# Patient Record
Sex: Female | Born: 2000 | Race: White | Hispanic: Yes | Marital: Single | Smoking: Never smoker
Health system: Southern US, Community
[De-identification: ages and names within clinical notes are randomized; demographics above are authoritative.]

## PROBLEM LIST (undated history)

## (undated) DIAGNOSIS — M21929 Unspecified acquired deformity of unspecified upper arm: Secondary | ICD-10-CM

---
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
## 2022-08-29 ENCOUNTER — Emergency Department
Admission: EM | Admit: 2022-08-29 | Discharge: 2022-08-29 | Disposition: A | Discharge status: Home / Self Care | Payer: Self-pay | Attending: Student in an Organized Health Care Education/Training Program | Admitting: Student in an Organized Health Care Education/Training Program

## 2022-08-29 ENCOUNTER — Encounter: Payer: Self-pay | Admitting: Emergency Medicine

## 2022-08-29 ENCOUNTER — Other Ambulatory Visit: Payer: Self-pay

## 2022-08-29 ENCOUNTER — Emergency Department: Payer: Self-pay

## 2022-08-29 DIAGNOSIS — Y92410 Unspecified street and highway as the place of occurrence of the external cause: Secondary | ICD-10-CM | POA: Diagnosis not present

## 2022-08-29 DIAGNOSIS — S40022A Contusion of left upper arm, initial encounter: Secondary | ICD-10-CM | POA: Insufficient documentation

## 2022-08-29 DIAGNOSIS — M545 Low back pain, unspecified: Secondary | ICD-10-CM | POA: Insufficient documentation

## 2022-08-29 DIAGNOSIS — S4992XA Unspecified injury of left shoulder and upper arm, initial encounter: Secondary | ICD-10-CM | POA: Diagnosis present

## 2022-08-29 LAB — POC URINE PREG, ED: Preg Test, Ur: NEGATIVE

## 2022-08-29 MED ORDER — LIDOCAINE 5 % EX PTCH
1.0000 | MEDICATED_PATCH | CUTANEOUS | Status: DC
  Administered 2022-08-29: 1 via TRANSDERMAL
  Filled 2022-08-29: qty 1

## 2022-08-29 MED ORDER — ACETAMINOPHEN 325 MG PO TABS
650.0000 mg | ORAL_TABLET | Freq: Once | ORAL | Status: AC
  Administered 2022-08-29: 650 mg via ORAL
  Filled 2022-08-29: qty 2
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
## 2022-08-29 NOTE — Discharge Instructions (Addendum)
You may continue to take Tylenol/ibuprofen per package instructions as needed for pain.  Please return for any new, worsening, or change in symptoms or concerns.  It was a pleasure caring for you today.
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
## 2023-04-22 ENCOUNTER — Ambulatory Visit: Payer: Self-pay

## 2023-06-16 ENCOUNTER — Ambulatory Visit: Payer: Self-pay

## 2024-06-09 ENCOUNTER — Ambulatory Visit: Payer: Self-pay

## 2024-06-23 ENCOUNTER — Ambulatory Visit: Payer: Self-pay | Admitting: Family Medicine

## 2024-06-23 ENCOUNTER — Encounter: Payer: Self-pay | Admitting: Family Medicine

## 2024-06-23 ENCOUNTER — Ambulatory Visit: Payer: Self-pay

## 2024-06-23 VITALS — BP 148/86 | HR 74 | Ht 67.0 in | Wt 241.4 lb

## 2024-06-23 DIAGNOSIS — Z3009 Encounter for other general counseling and advice on contraception: Secondary | ICD-10-CM

## 2024-06-23 DIAGNOSIS — Z113 Encounter for screening for infections with a predominantly sexual mode of transmission: Secondary | ICD-10-CM

## 2024-06-23 DIAGNOSIS — Z30430 Encounter for insertion of intrauterine contraceptive device: Secondary | ICD-10-CM

## 2024-06-23 DIAGNOSIS — Z124 Encounter for screening for malignant neoplasm of cervix: Secondary | ICD-10-CM

## 2024-06-23 LAB — WET PREP FOR TRICH, YEAST, CLUE
Clue Cell Exam: NEGATIVE
Trichomonas Exam: NEGATIVE
Yeast Exam: NEGATIVE

## 2024-06-23 LAB — HM HIV SCREENING LAB: HM HIV Screening: NEGATIVE

## 2024-06-23 MED ORDER — PARAGARD INTRAUTERINE COPPER IU IUD
1.0000 | INTRAUTERINE_SYSTEM | Freq: Once | INTRAUTERINE | Status: AC
  Administered 2024-06-23: 1 via INTRAUTERINE

## 2024-06-23 NOTE — Progress Notes (Signed)
 Pt is here PE and IUD insertion. Wet Prep results reviewed with pt and requires no treatment. IUD inserted successfully by Damien Satchel, [facility] and  pt tolerated well to the insertion process with no complications. Condoms declined, instruction sheet and reminder card given. Opportunity given to Patient to ask questions for any clarifications, questions answered. Wilkie Drought, RN.

## 2024-06-23 NOTE — Progress Notes (Signed)
 [employer] [facility] DEPARTMENT [facility] [address] [place] [address] Main phone: [phone]  Family Planning Visit - Initial Visit  Subjective:  Taylor Fitzgerald is a 23 y.o.  No obstetric history on file.   being seen today for an initial annual visit and to discuss reproductive life planning.  The patient is currently using IUD (or IUS) for pregnancy prevention. Patient does not want a pregnancy in the next year.   Patient reports they are looking for a method with the following characteristics:  High efficacy at preventing pregnancy Method that does not involve too much memory  Patient has the following medical conditions: Patient Active Problem List   Diagnosis Date Noted   Morbidly obese (HCC) 234 lbs 05/02/2021   Hirsutism 05/02/2021   Marijuana use 05/02/2021   Chief Complaint  Patient presents with   Annual Exam    Pt is here PE and IUD insertion   HPI Patient reports desire for a copper  IUD.  Worried about her fertility as she has not gotten pregnant on accident with unprotected sex and asking for a referral for fertility. Does not desire a pregnancy anytime soon and desires IUD. Discussed that fertility referrals are warranted if a person attempts to conceive for a full year without success while they are younger than 78.  Currently menstruating, last sex 2 weeks ago. Desires STI testing. No breast or vaginal concerns. BP 148/86 today: patient reports she is very nervous for IUD insertion. No hx of high blood pressure.   Review of Systems  All other systems reviewed and are negative.  Diabetes screening This patient is 23 y.o. with a BMI of Body mass index is 37.81 kg/m.Taylor Fitzgerald  Is patient eligible for diabetes screening (age >35 and BMI >25)?  no  Was Hgb A1c ordered? no  STI screening Patient reports 2 of partners in last year.  Does this patient desire STI screening?  Yes  Hepatitis C screening Has patient been  screened once for HCV in the past?  No  No results found for: HCVAB  Does the patient meet criteria for HCV testing? No  (If yes-- Screen for HCV through [facility]) Criteria:  Since the last HCV result, does the patient have any of the following? - Current drug use - Have a partner with drug use - Has been incarcerated  Hepatitis B screening Does the patient meet criteria for HBV testing? No Criteria:  -Household, sexual or needle sharing contact with HBV -History of drug use -HIV positive -Those with known Hep C  Cervical Cancer Screening  First pap completed today.  Health Maintenance Due  Topic Date Due   CHLAMYDIA SCREENING  Never done   HPV VACCINES (1 - 3-dose series) Never done   Meningococcal B Vaccine (1 of 2 - Standard) Never done   Hepatitis C Screening  Never done   DTaP/Tdap/Td (1 - Tdap) Never done   Hepatitis B Vaccines 19-59 Average Risk (1 of 3 - 19+ 3-dose series) Never done   Cervical Cancer Screening (Pap smear)  Never done   INFLUENZA VACCINE  Never done   COVID-19 Vaccine (1 - 2024-25 season) Never done    The following portions of the patient's history were reviewed and updated as appropriate: allergies, current medications, past family history, past medical history, past social history, past surgical history and problem list. Problem list updated.  See flowsheet for further details and programmatic requirements Hyperlink available at the top of  the signed note in blue.  Flow sheet content below:  Pregnancy Intention Screening Does the patient want to become pregnant in the next year?: No Does the patient's partner want to become pregnant in the next year?: No Would the patient like to discuss contraceptive options today?: Yes Other:  Password: [id number] Sexual History What age did you start your period?: 14 How often do you have your period?: monthly Date of last sex?: 06/09/24 Has the patient had unprotected sex within the last 5 days?:  No Do you have sex with men, women, both men and women?: Men only In the past 2 months how many partners have you had sex with?: 1 In the past 12 months, how many partners have you had sex with?: 2 Is it possible that any of your sex partners in the past 12 months had sex with someone else whild they were still in a sexual relationship with you?: No What ways do you have sex?: Vaginal Do you or your partner use condoms and/or dental dams every time you have vaginal, oral or anal sex?: Sometimes Do you douche?: Yes How often?: after every period Last time?: today Date of last HIV test?: 04/27/21 Have you ever had an STD?: No Have any of your partners had an STD?: No Have you or your partner ever shot up drugs?: No Have any of your partners used drugs in the past?: No Have you or your partners exchanged money or drugs for sex?: No Risk Factors for Hep B Household, sexual, or needle sharing contact of a person infected with Hep B: No Sexual contact with a person who uses drugs not as prescribed?: No Currently or Ever used drugs not as prescribed: No HIV Positive: No PRep Patient: No Men who have sex with men: No Have Hepatitis C: No History of Incarceration: No History of Homeslessness?: No Anal sex following anal drug use?: No Risk Factors for Hep C Currently using drugs not as prescribed: No Sexual partner(s) currently using drugs as not prescribed: No History of drug use: No HIV Positive: No People with a history of incarceration: No People born between the years of 33 and 81: No  Objective:   Vitals:   06/23/24 0838  BP: (!) 148/86  Pulse: 74  Weight: 241 lb 6.4 oz (109.5 kg)  Height: 5' 7 (1.702 m)   Physical Exam  Assessment and Plan:  Taylor Fitzgerald is a 23 y.o. female presenting to the [facility] Department for an initial annual wellness/contraceptive visit  1. Family planning (Primary)  Contraception counseling:  Reviewed options based on  patient desire and reproductive life plan. Patient is interested in IUD or IUS. This was provided to the patient today.  Risks, benefits, and typical effectiveness rates were reviewed.  Questions were answered.  Written information was also given to the patient to review.    The patient will follow up in 4 weeks for string check as desired. The patient was told to call with any further questions, or with any concerns about this method of contraception.  Emphasized use of condoms 100% of the time for STI prevention.  Emergency Contraception Precautions (ECP): Patient assessed for need of ECP. She is not a candidate based on no intercourse since LMP.   2. Encounter for insertion of ParaGard  IUD  - paragard  intrauterine copper  IUD 1 each  Procedure:  IUD Insertion  Patient presented to [facility] for IUD insertion. Her GC/CT screening was found to be up to date and  using WHO criteria we can be reasonably certain she is not pregnant: she is menstruating today. See Flowsheet for IUD check list.   IUD Insertion Procedure Note Patient identified, informed consent performed, consent signed.   Discussed risks of irregular bleeding, cramping, infection, malpositioning or misplacement of the IUD outside the uterus which may require further procedure such as laparoscopy. Time out was performed.  Vaginal lidocaine  was inserted 30 minutes prior to insertion.  Speculum placed in the vagina.  Cervix visualized.  Cleaned with Betadine x 3.  Grasped anteriorly with a single tooth tenaculum.  Uterus sounded to 7 cm.  IUD placed per manufacturer's recommendations.  Strings trimmed to 3 cm. Tenaculum was removed, good hemostasis noted.  Patient tolerated procedure well.   Patient was given post-procedure instructions- both agency handout and verbally by provider.  She was advised to have backup contraception for one week.  Patient was also asked to check IUD strings periodically or follow up in 4 weeks for IUD check.    3. Screening for venereal disease  - Chlamydia/Gonorrhea [facility] - WET PREP FOR TRICH, YEAST, CLUE - HIV [place] [facility] - Syphilis Serology, [facility]  4. Screening for cervical cancer  - IGP, rfx Aptima HPV ASCU   Return in about 4 weeks (around 07/21/2024) for string check.  No future appointments.  Damien FORBES Satchel, NP

## 2024-06-26 LAB — IGP, RFX APTIMA HPV ASCU: PAP Smear Comment: 0

## 2024-07-03 NOTE — Progress Notes (Signed)
 Duplicate encounter. See other note for full details.   Dorothyann Helling, MD 07/03/24  5:47 PM

## 2024-07-14 ENCOUNTER — Ambulatory Visit: Payer: Self-pay

## 2024-07-20 ENCOUNTER — Ambulatory Visit: Payer: Self-pay

## 2024-07-20 VITALS — BP 114/71 | HR 72 | Ht 67.0 in | Wt 236.8 lb

## 2024-07-20 DIAGNOSIS — Z30013 Encounter for initial prescription of injectable contraceptive: Secondary | ICD-10-CM

## 2024-07-20 DIAGNOSIS — Z3009 Encounter for other general counseling and advice on contraception: Secondary | ICD-10-CM

## 2024-07-20 DIAGNOSIS — Z3042 Encounter for surveillance of injectable contraceptive: Secondary | ICD-10-CM

## 2024-07-20 MED ORDER — MEDROXYPROGESTERONE ACETATE 150 MG/ML IM SUSP
150.0000 mg | INTRAMUSCULAR | Status: AC
  Administered 2024-07-20: 150 mg via INTRAMUSCULAR

## 2024-07-20 NOTE — Progress Notes (Signed)
 Pt is here for Birth control. Depo IM injection given to pt at the Rt Deltoid and pt tolerated well to the injection with no complications. Condoms declined and reminder card given. Opportunity given to Patient to ask questions for any clarifications, questions answered. Wilkie Drought, RN

## 2024-07-20 NOTE — Progress Notes (Signed)
 [employer] [facility] DEPARTMENT [facility] [address] [place] [address] Main phone: [phone]  Family Planning Visit - Repeat Yearly Visit  Subjective:  Taylor Fitzgerald is a 23 y.o. No obstetric history on file.  being seen today for an annual wellness visit and to discuss contraception options. The patient had IUD placed on 9/4 that fell out. Patient does not want a pregnancy in the next year.   Patient has the following medical problems:  Patient Active Problem List   Diagnosis Date Noted   Morbidly obese (HCC) 234 lbs 05/02/2021   Hirsutism 05/02/2021   Marijuana use 05/02/2021   Chief Complaint  Patient presents with   Contraception   HPI Patient reports her IUD fell out around 07/14/24, had been placed 06/23/24. Had prolonged bleeding this month, has been bleeding since 8/27. Some clots. No sex since IUD placed. She threw the IUD away when it fell out. Not sure what she would like for contraception. Wants to discuss options. No other needs/concerns.  Review of Systems  Neurological:  Positive for headaches.  All other systems reviewed and are negative.  See flowsheet for further details and programmatic requirements Hyperlink available at the top of the signed note in blue.  Flow sheet content below:  Pregnancy Intention Screening Does the patient want to become pregnant in the next year?: No Does the patient's partner want to become pregnant in the next year?: No Would the patient like to discuss contraceptive options today?: Yes Other:  Password: [id number] Contraception Wrap Up End Method: Hormonal Injection Contraception Counseling Provided: Yes How was the end contraceptive method provided?: Provided on site  Cervical Cancer Screening  Result Date Procedure Results Follow-ups  06/23/2024 IGP, rfx Aptima HPV ASCU DIAGNOSIS:: Comment Specimen adequacy:: Comment Clinician Provided ICD10: Comment Performed by:: Comment PAP  Smear Comment: . Note:: Comment Test Methodology: Comment PAP Reflex: Comment    Health Maintenance Due  Topic Date Due   CHLAMYDIA SCREENING  Never done   HPV VACCINES (1 - 3-dose series) Never done   Meningococcal B Vaccine (1 of 2 - Standard) Never done   Hepatitis C Screening  Never done   DTaP/Tdap/Td (1 - Tdap) Never done   Hepatitis B Vaccines 19-59 Average Risk (1 of 3 - 19+ 3-dose series) Never done   Influenza Vaccine  Never done   COVID-19 Vaccine (1 - 2024-25 season) Never done   The following portions of the patient's history were reviewed and updated as appropriate: allergies, current medications, past family history, past medical history, past social history, past surgical history and problem list. Problem list updated.  Objective:   Vitals:   07/20/24 1035  BP: 114/71  Pulse: 72  Weight: 236 lb 12.8 oz (107.4 kg)  Height: 5' 7 (1.702 m)   Physical Exam Constitutional:      Appearance: Normal appearance.  HENT:     Head: Normocephalic.     Mouth/Throat:     Mouth: Mucous membranes are moist.  Eyes:     General: No scleral icterus.       Right eye: No discharge.        Left eye: No discharge.  Pulmonary:     Effort: Pulmonary effort is normal.  Skin:    General: Skin is warm and dry.  Neurological:     General: No focal deficit present.     Mental Status: She is alert.  Psychiatric:        Mood and  Affect: Mood normal.        Behavior: Behavior normal.    Assessment and Plan:  Taylor Fitzgerald is a 23 y.o. female No obstetric history on file. presenting to the [facility] Department for an yearly wellness and contraception visit  1. Encounter for Depo-Provera contraception (Primary) - After discussion of options, Kursten decided to try Depo. Discussed possible side effects including weight gain. - Discussed irregular bleeding with the Depo. If she continues to have prolonged bleeding, she can try 400-600mg  of ibuprofen  three times per  day for 5 days to lessen/stop the bleeding. - medroxyPROGESTERone (DEPO-PROVERA) injection 150 mg  Return in about 11 weeks (around 10/05/2024).  No future appointments.  Taylor FORBES Satchel, NP

## 2024-07-29 ENCOUNTER — Other Ambulatory Visit: Payer: Self-pay | Admitting: Family Medicine

## 2024-07-29 ENCOUNTER — Encounter: Payer: Self-pay | Admitting: Family Medicine

## 2024-07-29 NOTE — Progress Notes (Signed)
 PAP smear reviewed, result: NILM (normal), HPV co-testing not indicated. Based on this result and patient's prior cervical cancer screenings, ASCCP currently recommends repeat PAP smear in 3 years.  Dorothyann Helling, MD 07/29/24  5:35 PM

## 2024-08-02 NOTE — Addendum Note (Signed)
 Addended by: ROSABEL PERKINS E on: 08/02/2024 11:43 AM   Modules accepted: Level of Service

## 2024-08-26 ENCOUNTER — Ambulatory Visit: Payer: Self-pay | Admitting: Family Medicine

## 2024-08-26 DIAGNOSIS — Z113 Encounter for screening for infections with a predominantly sexual mode of transmission: Secondary | ICD-10-CM

## 2024-08-26 LAB — WET PREP FOR TRICH, YEAST, CLUE
Clue Cell Exam: NEGATIVE
Trichomonas Exam: NEGATIVE
Yeast Exam: NEGATIVE

## 2024-08-26 LAB — HM HIV SCREENING LAB: HM HIV Screening: NEGATIVE

## 2024-08-26 NOTE — Progress Notes (Signed)
 [facility] Department STI clinic [address] [place] [address] Main phone: [phone]  STI screening visit  Subjective:  Taylor Fitzgerald is a 23 y.o. female being seen today for an STI screening visit. The patient reports they do not have symptoms.    Patient reports they are not pregnant . They do not desire a pregnancy in the next year. Patient is currently using hormonal injection to prevent pregnancy. They reported they are not interested in discussing contraception today.    Patient's last menstrual period was 08/20/2024 (exact date).  Patient has the following medical conditions:  Patient Active Problem List   Diagnosis Date Noted   Morbidly obese (HCC) 234 lbs 05/02/2021   Hirsutism 05/02/2021   Marijuana use 05/02/2021   Chief Complaint  Patient presents with   SEXUALLY TRANSMITTED DISEASE    HPI Patient reports to clinic for STI Testing. Asymptomatic- 1 partner in the last 2 months.   Does the patient using douching products? No  See flowsheet for further details and programmatic requirements Hyperlink available at the top of the signed note in blue.  Flow sheet content below:  Pregnancy Intention Screening Does the patient want to become pregnant in the next year?: No Does the patient's partner want to become pregnant in the next year?: No Would the patient like to discuss contraceptive options today?: No All Patients Anyone smoke around pt and/or pt's children?: Yes Anyone smoke inside pt's house?: Yes Anyone smoke inside car?: No Anyone smoke inside the workplace?: No Reason For STD Screen STD Screening: Is asymptomatic Have you ever had an STD?: No History of Antibiotic use in the past 2 weeks?: No STD Symptoms Denies all: Yes Risk Factors for Hep B Household, sexual, or needle sharing contact of a person infected with Hep B: No Sexual contact with a person who uses drugs not as prescribed?: No Currently or  Ever used drugs not as prescribed: No HIV Positive: No PRep Patient: No Men who have sex with men: No Have Hepatitis C: No History of Incarceration: No History of Homeslessness?: No Anal sex following anal drug use?: No Risk Factors for Hep C Currently using drugs not as prescribed: No Sexual partner(s) currently using drugs as not prescribed: No History of drug use: No HIV Positive: No People with a history of incarceration: No People born between the years of 64 and 53: No Advise Advised client to quit or stay quit. : Yes (vapes- declined resources) Abuse History Has patient ever been abused physically?: No Has patient ever been abused sexually?: No Does patient feel they have a problem with Anxiety?: No Does patient feel they have a problem with Depression?: No Counseling Patient counseled to use condoms with all sex: Condoms declined RTC in 2-3 weeks for test results: Yes Clinic will call if test results abnormal before test result appt.: Yes Test results given to patient Patient counseled to use condoms with all sex: Condoms declined   Screening for MPX risk:  Unexplained rash?  No   MSM?  No   Multiple or anonymous sex partners?  No   Any close or sexual contact with a person  diagnosed with MPX?  No   Any outside the [place]  where MPX is endemic?  No   High clinical suspicion for MPX?    -Unlikely to be chickenpox    -Lymphadenopathy    -Rash that presents in same phase of       evolution on any given  body part  No   Screenings: Last HIV test per patient/review of record was  Lab Results  Component Value Date   HMHIVSCREEN Negative - Validated 06/23/2024   No results found for: HIV   Last HEPC test per patient/review of record was No results found for: HMHEPCSCREEN No components found for: HEPC   Last HEPB test per patient/review of record was No components found for: HMHEPBSCREEN   Patient reports last pap was:   Lab Results  Component Value Date    SPECADGYN Comment 06/23/2024   Result Date Procedure Results Follow-ups  06/23/2024 IGP, rfx Aptima HPV ASCU DIAGNOSIS:: Comment Specimen adequacy:: Comment Clinician Provided ICD10: Comment Performed by:: Comment PAP Smear Comment: . Note:: Comment Test Methodology: Comment PAP Reflex: Comment     Immunization history:   There is no immunization history on file for this patient.  The following portions of the patient's history were reviewed and updated as appropriate: allergies, current medications, past medical history, past social history, past surgical history and problem list.  Objective:  There were no vitals filed for this visit.  Physical Exam Vitals and nursing note reviewed.  Constitutional:      Appearance: Normal appearance.  HENT:     Head: Normocephalic.     Mouth/Throat:     Mouth: Mucous membranes are moist.  Cardiovascular:     Rate and Rhythm: Normal rate.  Pulmonary:     Effort: Pulmonary effort is normal.  Abdominal:     Palpations: Abdomen is soft.  Genitourinary:    Comments: Declined genital exam- no symptoms, self swabbed Musculoskeletal:        General: Normal range of motion.  Lymphadenopathy:     Head:     Right side of head: No submandibular, preauricular or posterior auricular adenopathy.     Left side of head: No submandibular, preauricular or posterior auricular adenopathy.     Cervical: No cervical adenopathy.     Upper Body:     Right upper body: No supraclavicular or axillary adenopathy.     Left upper body: No supraclavicular or axillary adenopathy.  Skin:    General: Skin is warm and dry.  Neurological:     Mental Status: She is alert and oriented to person, place, and time.  Psychiatric:        Mood and Affect: Mood normal.      Assessment and Plan:  ELLIET Fitzgerald is a 23 y.o. female presenting to the [facility] Department for STI screening  1. Screening for venereal disease (Primary)  -  Chlamydia/Gonorrhea [facility] - HIV [facility] - Syphilis Serology, [facility] - WET PREP FOR TRICH, YEAST, CLUE   Patient accepted the following screenings: vaginal CT/GC swab, vaginal wet prep, HIV, and RPR Patient meets criteria for HepB screening? No. Ordered? not applicable Patient meets criteria for HepC screening? No. Ordered? not applicable  Treat wet prep per standing order Discussed time line for [facility] results and that patient will be called with positive results and encouraged patient to call if she had not heard in 2 weeks.  Counseled to return or seek care for continued or worsening symptoms Recommended repeat testing in 3 months with positive results. Recommended condom use with all sex for STI prevention.   Return if symptoms worsen or fail to improve.  No future appointments.  Verneta Bers, [place]

## 2024-08-31 ENCOUNTER — Ambulatory Visit: Payer: Self-pay | Admitting: Family Medicine

## 2024-10-10 LAB — HM HIV SCREENING LAB: HM HIV Screening: NEGATIVE

## 2024-10-11 ENCOUNTER — Ambulatory Visit: Payer: Self-pay

## 2024-10-11 VITALS — BP 117/80 | HR 82 | Ht 67.0 in | Wt 234.2 lb

## 2024-10-11 DIAGNOSIS — Z113 Encounter for screening for infections with a predominantly sexual mode of transmission: Secondary | ICD-10-CM

## 2024-10-11 LAB — WET PREP FOR TRICH, YEAST, CLUE
Clue Cell Exam: NEGATIVE
Trichomonas Exam: NEGATIVE
Yeast Exam: NEGATIVE

## 2024-10-11 NOTE — Patient Instructions (Signed)
 Pt here for STI screening and Depo Provera  injection.  Wet mount results reviewed with patient.  No treatment needed at this time.  Depo Provera  150mg  IM given in R deltoid without difficulty.  Depo reminder card given.  Condoms declined.-Shiheem Corporan, RN

## 2024-10-11 NOTE — Progress Notes (Signed)
 " [employer] [facility] DEPARTMENT [facility] [address] [place] [address] Main phone: [phone]  Women's Health Problem Visit   Subjective:  Taylor Fitzgerald is a 23 y.o. being seen today for STI testing and depo.   Chief Complaint  Patient presents with   Acute Visit   HPI Patient reports desire for STI testing. She is also hoping to have her Depo today 1 week early (due 12/30). No symptoms or concerns. No exposures.  Health Maintenance Due  Topic Date Due   CHLAMYDIA SCREENING  Never done   HPV VACCINES (1 - 3-dose series) Never done   Meningococcal B Vaccine (1 of 2 - Standard) Never done   Hepatitis C Screening  Never done   DTaP/Tdap/Td (1 - Tdap) Never done   Hepatitis B Vaccines 19-59 Average Risk (1 of 3 - 19+ 3-dose series) Never done   Influenza Vaccine  Never done   COVID-19 Vaccine (1 - 2025-26 season) Never done   Review of Systems  All other systems reviewed and are negative.  The following portions of the patient's history were reviewed and updated as appropriate: allergies, current medications, past family history, past medical history, past social history, past surgical history and problem list. Problem list updated.  See flowsheet for other program required questions.  Objective:   Vitals:   10/11/24 1101  BP: 117/80  Pulse: 82  Weight: 234 lb 3.2 oz (106.2 kg)  Height: 5' 7 (1.702 m)   Physical Exam Vitals and nursing note reviewed.  Constitutional:      Appearance: Normal appearance.  HENT:     Head: Normocephalic and atraumatic.     Comments: No nits or hair loss on scalp, brows, and lashes    Mouth/Throat:     Mouth: Mucous membranes are moist.     Pharynx: Oropharynx is clear. No oropharyngeal exudate or posterior oropharyngeal erythema.  Eyes:     General:        Right eye: No discharge.        Left eye: No discharge.     Conjunctiva/sclera: Conjunctivae normal.     Right eye:  Right conjunctiva is not injected.     Left eye: Left conjunctiva is not injected.  Pulmonary:     Effort: Pulmonary effort is normal.  Abdominal:     Tenderness: There is no abdominal tenderness. There is no rebound.  Genitourinary:    Comments: Politely declined genital exam. Lymphadenopathy:     Cervical: No cervical adenopathy.     Upper Body:     Right upper body: No supraclavicular adenopathy.     Left upper body: No supraclavicular adenopathy.     Comments: Patient declines pelvic exam, inguinal lymph nodes not evaluated.  Skin:    General: Skin is warm and dry.     Findings: No lesion or rash.  Neurological:     Mental Status: She is alert and oriented to person, place, and time.  Psychiatric:        Mood and Affect: Mood normal.    Assessment and Plan:  Taylor Fitzgerald is a 23 y.o. female presenting to the [facility] Department for a Women's Health problem visit  1. Screening for venereal disease (Primary)  - WET PREP FOR TRICH, YEAST, CLUE - Chlamydia/Gonorrhea [facility] - HIV [place] [facility] - Syphilis Serology, [place] [facility]   Return in about 11 weeks (around 12/27/2024).  No future appointments.  Damien  FORBES Satchel, NP "
